# Patient Record
Sex: Female | Born: 1980 | Race: Black or African American | Hispanic: No | Marital: Single | Smoking: Former smoker
Health system: Southern US, Community
[De-identification: ages and names within clinical notes are randomized; demographics above are authoritative.]

---

## 2005-10-28 HISTORY — PX: CHOLECYSTECTOMY: SHX55

## 2011-03-04 ENCOUNTER — Emergency Department (HOSPITAL_COMMUNITY)
Admission: EM | Admit: 2011-03-04 | Discharge: 2011-03-04 | Disposition: A | Discharge status: Home / Self Care | Payer: Medicaid Other | Attending: Emergency Medicine | Admitting: Emergency Medicine

## 2011-03-04 DIAGNOSIS — K2990 Gastroduodenitis, unspecified, without bleeding: Secondary | ICD-10-CM | POA: Insufficient documentation

## 2011-03-04 DIAGNOSIS — R10811 Right upper quadrant abdominal tenderness: Secondary | ICD-10-CM | POA: Insufficient documentation

## 2011-03-04 DIAGNOSIS — Z9089 Acquired absence of other organs: Secondary | ICD-10-CM | POA: Insufficient documentation

## 2011-03-04 DIAGNOSIS — K2970 Gastritis, unspecified, without bleeding: Secondary | ICD-10-CM | POA: Insufficient documentation

## 2011-03-04 DIAGNOSIS — I10 Essential (primary) hypertension: Secondary | ICD-10-CM | POA: Insufficient documentation

## 2011-03-04 LAB — URINALYSIS, ROUTINE W REFLEX MICROSCOPIC
Nitrite: NEGATIVE
Specific Gravity, Urine: 1.028 (ref 1.005–1.030)
pH: 6 (ref 5.0–8.0)

## 2011-03-04 LAB — COMPREHENSIVE METABOLIC PANEL
ALT: 7 U/L (ref 0–35)
AST: 14 U/L (ref 0–37)
Alkaline Phosphatase: 71 U/L (ref 39–117)
GFR calc Af Amer: >60 mL/min (ref >60)
Glucose, Bld: 101 mg/dL — ABNORMAL HIGH (ref 70–99)
Potassium: 3.8 mEq/L (ref 3.5–5.1)
Sodium: 136 mEq/L (ref 135–145)
Total Protein: 6.4 g/dL (ref 6.0–8.3)

## 2011-03-04 LAB — POCT PREGNANCY, URINE: Preg Test, Ur: NEGATIVE

## 2011-03-04 LAB — DIFFERENTIAL
Basophils Absolute: 0 10*3/uL (ref 0.0–0.1)
Eosinophils Relative: 4 % (ref 0–5)
Lymphocytes Relative: 27 % (ref 12–46)
Neutro Abs: 7.2 10*3/uL (ref 1.7–7.7)

## 2011-03-04 LAB — CBC
HCT: 36.4 % (ref 36.0–46.0)
Hemoglobin: 11.7 g/dL — ABNORMAL LOW (ref 12.0–15.0)
RDW: 13.3 % (ref 11.5–15.5)
WBC: 11.2 10*3/uL — ABNORMAL HIGH (ref 4.0–10.5)

## 2011-03-21 ENCOUNTER — Other Ambulatory Visit: Payer: Self-pay | Admitting: Obstetrics and Gynecology

## 2011-03-21 ENCOUNTER — Encounter (INDEPENDENT_AMBULATORY_CARE_PROVIDER_SITE_OTHER): Payer: Medicaid Other | Admitting: Obstetrics and Gynecology

## 2011-03-21 DIAGNOSIS — R1084 Generalized abdominal pain: Secondary | ICD-10-CM

## 2011-03-21 DIAGNOSIS — R1011 Right upper quadrant pain: Secondary | ICD-10-CM

## 2011-03-22 NOTE — Group Therapy Note (Signed)
Erica Wright, Erica Wright               ACCOUNT NO.:  [id number]  MEDICAL RECORD NO.:  [id number]           PATIENT TYPE:  A  LOCATION:  [facility]                   FACILITY:  [facility]  PHYSICIAN:  Argentina Donovan, MD        DATE OF BIRTH:  11-May-1981  DATE OF SERVICE:  03/21/2011                                 CLINIC NOTE  The patient is a 30 year old African American female, gravida 3, para 2- 0-1-90, with a 34-year-old child.  She weighs 297 pounds and 5 feet 8 inches tall.  Her blood pressure is normal at 115/76.  She has no known allergies.  She smokes one cigarette roll a day.  She is on Ortho-Tri- Cyclen Lo for birth control, and was referred by the emergency room at [facility] because she has upper right quadrant pain, which seems to come and go, sometimes 3-5 times in a day where that she has to go to fetal position and seems to be increased when she is having her period. She had her last Pap smear a year ago in March and her Pap smears have always been normal.  Abdomen exam is obese abdomen, this is soft, nontender even in the area that she points to where she is most tender. There is no guarding or rebound even she said that she usually feels a lump in that area when the pain comes by palpate, no masses.  No evidence of muscle spasm or hernia.  Not exactly sure what is going on in this patient.  She has had a gallbladder removed and this has started after that episode.  I am going to get a CT of her abdomen and pelvis, have her come back, and we will do a Pap smear at that time, and then evaluate those and see if there is any reason we can find for the pain.  IMPRESSION:  Upper right quadrant pain increased when she is having her period, intermittent, but disabling when it occurs, sometimes several times a day, it lasts for 10-15 minutes at that time.          ______________________________ Argentina Donovan, MD    PR/MEDQ  D:  03/21/2011  T:  03/22/2011  Job:  [id number]

## 2011-03-28 ENCOUNTER — Other Ambulatory Visit: Payer: Self-pay | Admitting: Obstetrics and Gynecology

## 2011-03-28 ENCOUNTER — Ambulatory Visit (HOSPITAL_COMMUNITY)
Admission: RE | Admit: 2011-03-28 | Discharge: 2011-03-28 | Disposition: A | Discharge status: Home / Self Care | Payer: Medicaid Other | Source: Ambulatory Visit | Attending: Obstetrics and Gynecology | Admitting: Obstetrics and Gynecology

## 2011-03-28 DIAGNOSIS — R1011 Right upper quadrant pain: Secondary | ICD-10-CM

## 2011-03-28 MED ORDER — IOHEXOL 300 MG/ML  SOLN
100.0000 mL | Freq: Once | INTRAMUSCULAR | Status: AC | PRN
  Administered 2011-03-28: 100 mL via INTRAVENOUS

## 2011-04-10 ENCOUNTER — Ambulatory Visit (INDEPENDENT_AMBULATORY_CARE_PROVIDER_SITE_OTHER): Payer: Medicaid Other | Admitting: Obstetrics and Gynecology

## 2011-04-10 ENCOUNTER — Other Ambulatory Visit: Payer: Self-pay | Admitting: Obstetrics and Gynecology

## 2011-04-10 DIAGNOSIS — Z124 Encounter for screening for malignant neoplasm of cervix: Secondary | ICD-10-CM

## 2011-04-10 DIAGNOSIS — Z01419 Encounter for gynecological examination (general) (routine) without abnormal findings: Secondary | ICD-10-CM

## 2011-04-11 NOTE — Assessment & Plan Note (Signed)
Erica Wright, Erica Wright               ACCOUNT NO.:  [id number]  MEDICAL RECORD NO.:  [id number]           PATIENT TYPE:  A  LOCATION:  [facility]         FACILITY:  [facility]  PHYSICIAN:  Argentina Donovan, MD        DATE OF BIRTH:  [birth year]  DATE OF SERVICE:  04/10/2011                                 CLINIC NOTE  HISTORY OF PRESENT ILLNESS:  This patient is a 30 year old gravida 3, para 2-0-1-2 African American female who has undergone gallbladder surgery and since then has had pain and the area gallbladder seems to be aggravated whenever she has a period increased.  I got a CT of her pelvis and abdomen and that was completely normal.  Last time she was seen, she had a completely normal blood pressure and today she has a blood pressure of 147/93 and 134/90.  She said somebody gave her blood pressure medication, she shows to me and it shows that she was given hydrochlorothiazide 12.5 mg.  She never took it, however, because before that she was on 25 and she had terrible leg cramps.  I told her that was probably from a lack of potassium and she needs potassium if the blood pressure is up.  So, I am going to check her blood pressure before she leaves and add some potassium to her regime.  IMPRESSION:  Hypertension.          ______________________________ Argentina Donovan, MD    PR/MEDQ  D:  04/10/2011  T:  04/11/2011  Job:  [id number]

## 2011-06-18 ENCOUNTER — Emergency Department (HOSPITAL_COMMUNITY)
Admission: EM | Admit: 2011-06-18 | Discharge: 2011-06-18 | Disposition: A | Discharge status: Home / Self Care | Payer: No Typology Code available for payment source | Attending: Emergency Medicine | Admitting: Emergency Medicine

## 2011-06-18 ENCOUNTER — Emergency Department (HOSPITAL_COMMUNITY): Payer: No Typology Code available for payment source

## 2011-06-18 DIAGNOSIS — T148XXA Other injury of unspecified body region, initial encounter: Secondary | ICD-10-CM | POA: Insufficient documentation

## 2011-06-18 DIAGNOSIS — Y92410 Unspecified street and highway as the place of occurrence of the external cause: Secondary | ICD-10-CM | POA: Insufficient documentation

## 2011-06-18 DIAGNOSIS — S139XXA Sprain of joints and ligaments of unspecified parts of neck, initial encounter: Secondary | ICD-10-CM | POA: Insufficient documentation

## 2011-06-18 DIAGNOSIS — S335XXA Sprain of ligaments of lumbar spine, initial encounter: Secondary | ICD-10-CM | POA: Insufficient documentation

## 2013-03-26 ENCOUNTER — Telehealth: Payer: Self-pay | Admitting: Obstetrics and Gynecology

## 2013-03-26 ENCOUNTER — Other Ambulatory Visit: Payer: Self-pay | Admitting: Obstetrics & Gynecology

## 2013-03-26 DIAGNOSIS — IMO0001 Reserved for inherently not codable concepts without codable children: Secondary | ICD-10-CM

## 2013-03-26 MED ORDER — NORGESTIMATE-ETH ESTRADIOL 0.25-35 MG-MCG PO TABS: 1.0000 | ORAL_TABLET | Freq: Every day | ORAL | Status: DC

## 2013-03-26 NOTE — Telephone Encounter (Signed)
Patient called requesting a refill of birth control pill to supplement her until her scheduled annual appt on 04/23/13. Per protocol Rx Refill sent. Patient understands that she will need to make it to this appointment otherwise there will be no more refills. Patient states understanding.

## 2013-04-06 ENCOUNTER — Encounter (HOSPITAL_COMMUNITY): Payer: Self-pay | Admitting: Emergency Medicine

## 2013-04-06 ENCOUNTER — Emergency Department (HOSPITAL_COMMUNITY)
Admission: EM | Admit: 2013-04-06 | Discharge: 2013-04-06 | Disposition: A | Discharge status: Home / Self Care | Payer: Medicaid Other | Attending: Emergency Medicine | Admitting: Emergency Medicine

## 2013-04-06 ENCOUNTER — Emergency Department (HOSPITAL_COMMUNITY): Payer: Medicaid Other

## 2013-04-06 DIAGNOSIS — R0602 Shortness of breath: Secondary | ICD-10-CM | POA: Insufficient documentation

## 2013-04-06 DIAGNOSIS — T148XXA Other injury of unspecified body region, initial encounter: Secondary | ICD-10-CM

## 2013-04-06 DIAGNOSIS — R071 Chest pain on breathing: Secondary | ICD-10-CM | POA: Insufficient documentation

## 2013-04-06 DIAGNOSIS — R0789 Other chest pain: Secondary | ICD-10-CM

## 2013-04-06 DIAGNOSIS — F17200 Nicotine dependence, unspecified, uncomplicated: Secondary | ICD-10-CM | POA: Insufficient documentation

## 2013-04-06 LAB — CBC
MCH: 29.1 pg (ref 26.0–34.0)
MCHC: 31.9 g/dL (ref 30.0–36.0)
MCV: 91.2 fL (ref 78.0–100.0)
Platelets: 416 10*3/uL — ABNORMAL HIGH (ref 150–400)
RDW: 13.5 % (ref 11.5–15.5)
WBC: 14.5 10*3/uL — ABNORMAL HIGH (ref 4.0–10.5)

## 2013-04-06 LAB — BASIC METABOLIC PANEL
BUN: 8 mg/dL (ref 6–23)
Calcium: 9.7 mg/dL (ref 8.4–10.5)
Chloride: 104 mEq/L (ref 96–112)
Creatinine, Ser: 0.75 mg/dL (ref 0.50–1.10)
GFR calc Af Amer: >90 mL/min (ref >90)
GFR calc non Af Amer: >90 mL/min (ref >90)

## 2013-04-06 LAB — POCT I-STAT TROPONIN I: Troponin i, poc: 0 ng/mL (ref 0.00–0.08)

## 2013-04-06 NOTE — ED Provider Notes (Signed)
Medical screening examination/treatment/procedure(s) were performed by non-physician practitioner and as supervising physician I was immediately available for consultation/collaboration.   Gwyneth Sprout, MD 04/06/13 [phone]

## 2013-04-06 NOTE — ED Provider Notes (Signed)
History     CSN: [id number]  Arrival date & time 04/06/13  1241   First MD Initiated Contact with Patient 04/06/13 1507      Chief Complaint  Patient presents with  . Chest Pain    (Consider location/radiation/quality/duration/timing/severity/associated sxs/prior treatment) HPI Comments: 32 year old female with no significant past medical history presents to the emergency department complaining of sudden onset midsternal chest pain occurring around 12:00 noon today after hanging up the phone while at work and standing up at her desk. Pain lasted about 10 minutes with intermittent sharp spurts, worse when moving her arms closer together or using her left arm in any way. She did not try any alleviating factors. No pain at rest. When she had the pain, states she has shortness of breath. Denies nausea, vomiting or diaphoresis. Denies fever, chills or cough.  Patient is a 32 y.o. female presenting with chest pain. The history is provided by the patient.  Chest Pain Associated symptoms: shortness of breath   Associated symptoms: no abdominal pain, no back pain, no cough, no diaphoresis, no dizziness, no fever, no headache, no nausea and not vomiting     History reviewed. No pertinent past medical history.  History reviewed. No pertinent past surgical history.  History reviewed. No pertinent family history.  History  Substance Use Topics  . Smoking status: Current Every Day Smoker  . Smokeless tobacco: Not on file  . Alcohol Use: Yes     Comment: occ    OB History   Grav Para Term Preterm Abortions TAB SAB Ect Mult Living                  Review of Systems  Constitutional: Negative for fever, chills and diaphoresis.  HENT: Negative for neck pain and neck stiffness.   Respiratory: Positive for shortness of breath. Negative for cough.   Cardiovascular: Positive for chest pain.  Gastrointestinal: Negative for nausea, vomiting and abdominal pain.  Musculoskeletal: Negative for  back pain.  Neurological: Negative for dizziness, light-headedness and headaches.  Psychiatric/Behavioral: Negative for confusion.  All other systems reviewed and are negative.    Allergies  Review of patient's allergies indicates no known allergies.  Home Medications   Current Outpatient Rx  Name  Route  Sig  Dispense  Refill  . norgestimate-ethinyl estradiol (ORTHO-CYCLEN,SPRINTEC,PREVIFEM) 0.25-35 MG-MCG tablet   Oral   Take 1 tablet by mouth daily.   1 Package   1     BP 156/99  Pulse 78  Temp(Src) 98.3 F (36.8 C) (Oral)  Resp 18  SpO2 99%  LMP 03/23/2013  Physical Exam  Nursing note and vitals reviewed. Constitutional: She is oriented to person, place, and time. She appears well-developed. No distress.  Obese  HENT:  Head: Normocephalic and atraumatic.  Mouth/Throat: Oropharynx is clear and moist.  Eyes: Conjunctivae and EOM are normal. Pupils are equal, round, and reactive to light.  Neck: Normal range of motion. Neck supple.  Cardiovascular: Normal rate, regular rhythm, normal heart sounds and intact distal pulses.   Pulmonary/Chest: Effort normal and breath sounds normal. No respiratory distress. She has no wheezes. She has no rales. She exhibits tenderness.    Abdominal: Soft. Bowel sounds are normal. There is no tenderness.  Musculoskeletal: Normal range of motion. She exhibits no edema.  Neurological: She is alert and oriented to person, place, and time.  Skin: Skin is warm and dry. She is not diaphoretic.  Psychiatric: She has a normal mood and affect. Her behavior is  normal.    ED Course  Procedures (including critical care time)  Labs Reviewed  CBC - Abnormal; Notable for the following:    WBC 14.5 (*)    Hemoglobin 11.6 (*)    Platelets 416 (*)    All other components within normal limits  BASIC METABOLIC PANEL  POCT I-STAT TROPONIN I   Dg Chest 2 View  04/06/2013   *RADIOLOGY REPORT*  Clinical Data: Chest pain  CHEST - 2 VIEW   Comparison: None.  Findings: Lungs are clear.  Heart is mildly enlarged with normal pulmonary vascularity.  No adenopathy.  No pneumothorax.  No bone lesions.  IMPRESSION:   Heart is mildly enlarged.  No edema or consolidation.   Original Report Authenticated By: Bretta Bang, M.D.     Date: 04/06/2013  Rate: 91  Rhythm: normal sinus rhythm  QRS Axis: left  Intervals: normal  ST/T Wave abnormalities: normal  Conduction Disutrbances:none  Narrative Interpretation: no stemi  Old EKG Reviewed: none available   1. Chest wall pain   2. Muscle strain       MDM  32 year old female with reproducible chest pain on exam. Physical exam otherwise unremarkable. Chest pain only present with certain movements. Mild leukocytosis of 14.5, labs otherwise unremarkable. Afebrile and in no apparent distress. No family history of heart disease. Discharge with instructions to rest and take ibuprofen or Aleve for chest wall pain. Return precautions discussed. Resource guide given for PCP followup.      Trevor Mace, PA-C 04/06/13 1541

## 2013-04-06 NOTE — ED Notes (Signed)
Pt c/o midsternal CP with SOB starting today; pt sts worse with positioning and movement

## 2013-04-06 NOTE — ED Notes (Signed)
The pt only has pain if she moves or stretches

## 2013-04-23 ENCOUNTER — Telehealth: Payer: Self-pay | Admitting: *Deleted

## 2013-04-23 ENCOUNTER — Ambulatory Visit: Payer: Medicaid Other | Admitting: Advanced Practice Midwife

## 2013-04-23 DIAGNOSIS — IMO0001 Reserved for inherently not codable concepts without codable children: Secondary | ICD-10-CM

## 2013-04-23 MED ORDER — NORGESTIMATE-ETH ESTRADIOL 0.25-35 MG-MCG PO TABS: 1.0000 | ORAL_TABLET | Freq: Every day | ORAL | Status: DC

## 2013-04-23 NOTE — Telephone Encounter (Signed)
rx refill per dr Jolayne Panther

## 2013-08-05 ENCOUNTER — Ambulatory Visit (INDEPENDENT_AMBULATORY_CARE_PROVIDER_SITE_OTHER): Payer: Medicaid Other | Admitting: Advanced Practice Midwife

## 2013-08-05 ENCOUNTER — Encounter: Payer: Self-pay | Admitting: Advanced Practice Midwife

## 2013-08-05 ENCOUNTER — Other Ambulatory Visit (HOSPITAL_COMMUNITY)
Admission: RE | Admit: 2013-08-05 | Discharge: 2013-08-05 | Disposition: A | Discharge status: Home / Self Care | Payer: Medicaid Other | Source: Ambulatory Visit | Attending: Obstetrics and Gynecology | Admitting: Obstetrics and Gynecology

## 2013-08-05 VITALS — BP 139/92 | HR 93 | Temp 99.0°F | Ht 68.0 in | Wt 296.9 lb

## 2013-08-05 DIAGNOSIS — Z3041 Encounter for surveillance of contraceptive pills: Secondary | ICD-10-CM

## 2013-08-05 DIAGNOSIS — Z01419 Encounter for gynecological examination (general) (routine) without abnormal findings: Secondary | ICD-10-CM | POA: Insufficient documentation

## 2013-08-05 DIAGNOSIS — F17210 Nicotine dependence, cigarettes, uncomplicated: Secondary | ICD-10-CM

## 2013-08-05 DIAGNOSIS — N898 Other specified noninflammatory disorders of vagina: Secondary | ICD-10-CM

## 2013-08-05 DIAGNOSIS — Z0000 Encounter for general adult medical examination without abnormal findings: Secondary | ICD-10-CM

## 2013-08-05 DIAGNOSIS — Z1151 Encounter for screening for human papillomavirus (HPV): Secondary | ICD-10-CM | POA: Insufficient documentation

## 2013-08-05 DIAGNOSIS — F17200 Nicotine dependence, unspecified, uncomplicated: Secondary | ICD-10-CM

## 2013-08-05 MED ORDER — METRONIDAZOLE 500 MG PO TABS: 500.0000 mg | ORAL_TABLET | Freq: Two times a day (BID) | ORAL | Status: AC

## 2013-08-05 MED ORDER — NORGESTIM-ETH ESTRAD TRIPHASIC 0.18/0.215/0.25 MG-25 MCG PO TABS: 1.0000 | ORAL_TABLET | Freq: Every day | ORAL | Status: DC

## 2013-08-05 NOTE — Progress Notes (Signed)
  Subjective:     Erica Wright is a 32 y.o. female and is here for a comprehensive physical exam and Pap. The patient reports vaginal discharge, increased following her menses.    History   Social History  . Marital Status: Single    Spouse Name: N/A    Number of Children: N/A  . Years of Education: N/A   Occupational History  . Not on file.   Social History Main Topics  . Smoking status: Current Every Day Smoker  . Smokeless tobacco: Never Used  . Alcohol Use: Yes     Comment: occ  . Drug Use: No  . Sexual Activity: Yes   Other Topics Concern  . Not on file   Social History Narrative  . No narrative on file   Health Maintenance  Topic Date Due  . Tetanus/tdap  02/01/2000  . Influenza Vaccine  05/28/2013  . Pap Smear  04/09/2014    The following portions of the patient's history were reviewed and updated as appropriate: allergies, current medications, past family history, past medical history, past social history, past surgical history and problem list.  Review of Systems A comprehensive review of systems was negative.   Objective:    BP 139/92  Pulse 93  Temp(Src) 99 F (37.2 C) (Oral)  Ht 5\' 8"  (1.727 m)  Wt 296 lb 14.4 oz (134.673 kg)  BMI 45.15 kg/m2  LMP 08/03/2013 General appearance: alert, cooperative and no distress Neck: no adenopathy, no carotid bruit, no JVD, supple, symmetrical, trachea midline and thyroid not enlarged, symmetric, no tenderness/mass/nodules Lungs: clear to auscultation bilaterally Breasts: normal appearance, no masses or tenderness, Inspection negative, Normal to palpation without dominant masses Heart: regular rate and rhythm, S1, S2 normal, no murmur, click, rub or gallop Abdomen: soft, non-tender; bowel sounds normal; no masses,  no organomegaly Pelvic: cervix normal in appearance, external genitalia normal, no adnexal masses or tenderness, no cervical motion tenderness, rectovaginal septum normal, uterus normal size, shape,  and consistency and thin white discharge Extremities: extremities normal, atraumatic, no cyanosis or edema and Homans sign is negative, no sign of DVT Skin: Skin color, texture, turgor normal. No rashes or lesions Lymph nodes: Cervical, supraclavicular, and axillary nodes normal.    Assessment:     1. Annual physical exam   2. Vaginal discharge        Plan:    1.  Discussed progesterone only and long acting contraceptives with pt as most effective, safest forms of hormonal birth control, with smoking increasing risks with OCPs.  No other risk factors and pt prefers to continue OCPs at this time.  Renewed prescription.  2.  Flagyl 500 mg BID prescribed. Wet prep pending. 3.  F/U in 1  Year or as needed

## 2013-08-05 NOTE — Patient Instructions (Signed)
Recommend probiotics, like yogurt, to prevent vaginal infections.

## 2013-08-06 ENCOUNTER — Telehealth: Payer: Self-pay | Admitting: Advanced Practice Midwife

## 2013-08-06 DIAGNOSIS — Z3041 Encounter for surveillance of contraceptive pills: Secondary | ICD-10-CM | POA: Insufficient documentation

## 2013-08-06 DIAGNOSIS — F17210 Nicotine dependence, cigarettes, uncomplicated: Secondary | ICD-10-CM | POA: Insufficient documentation

## 2013-08-06 LAB — WET PREP, GENITAL

## 2013-08-06 NOTE — Telephone Encounter (Signed)
Called pt to let her know about positive trichomonas results on wet prep 08/05/13.  Pt prescribed Flagyl 500 BID x7 days during office visit for suspected BV, so no additional treatment needed.  Discussed importance of partner treatment with pt. Pt stated understanding.

## 2013-08-06 NOTE — Telephone Encounter (Signed)
Left message for pt to return call regarding lab results.  Pt wet prep positive for Trich.  Pt prescribed Flagyl 500 mg BID x7 days at time of visit for possible BV, so no additional tx needed.  Need to discuss partner treatment with pt.

## 2014-04-11 ENCOUNTER — Other Ambulatory Visit: Payer: Self-pay | Admitting: Obstetrics and Gynecology

## 2014-05-06 ENCOUNTER — Other Ambulatory Visit: Payer: Self-pay | Admitting: Obstetrics and Gynecology

## 2014-05-09 ENCOUNTER — Other Ambulatory Visit: Payer: Self-pay | Admitting: Obstetrics and Gynecology

## 2014-08-31 ENCOUNTER — Telehealth: Payer: Self-pay | Admitting: *Deleted

## 2014-08-31 DIAGNOSIS — Z0000 Encounter for general adult medical examination without abnormal findings: Secondary | ICD-10-CM

## 2014-08-31 MED ORDER — NORGESTIM-ETH ESTRAD TRIPHASIC 0.18/0.215/0.25 MG-25 MCG PO TABS: 1.0000 | ORAL_TABLET | Freq: Every day | ORAL | Status: DC

## 2014-08-31 NOTE — Telephone Encounter (Signed)
Pt called and requested refill of OCP's.  She stated that she took her last pill 4 days ago and her pharmacy has sent a refill request to the provider but it has not been filled.  I advised pt that I can authorize 3 months of refill and she will need annual Gyn exam prior to any additional refills.  Pt voiced understanding and agreed.  She stated that a detailed message can be left on her voice mail regarding her appt once it is scheduled.

## 2014-10-19 ENCOUNTER — Ambulatory Visit (INDEPENDENT_AMBULATORY_CARE_PROVIDER_SITE_OTHER): Payer: Medicaid Other | Admitting: Obstetrics & Gynecology

## 2014-10-19 ENCOUNTER — Encounter: Payer: Self-pay | Admitting: Obstetrics & Gynecology

## 2014-10-19 VITALS — BP 129/84 | HR 72 | Ht 68.0 in | Wt 326.1 lb

## 2014-10-19 DIAGNOSIS — N898 Other specified noninflammatory disorders of vagina: Secondary | ICD-10-CM

## 2014-10-19 DIAGNOSIS — Z01419 Encounter for gynecological examination (general) (routine) without abnormal findings: Secondary | ICD-10-CM

## 2014-10-19 DIAGNOSIS — Z0000 Encounter for general adult medical examination without abnormal findings: Secondary | ICD-10-CM

## 2014-10-19 MED ORDER — NORGESTIM-ETH ESTRAD TRIPHASIC 0.18/0.215/0.25 MG-25 MCG PO TABS: 1.0000 | ORAL_TABLET | Freq: Every day | ORAL | Status: AC

## 2014-10-19 NOTE — Progress Notes (Signed)
Patient ID: Erica Wright, female   DOB: 01/04/1981, 33 y.o.   MRN: [id number] Subjective:     Erica Wright is a 33 y.o. female here for a routine exam.  Current complaints: none.  Pt on OCP's.  Wants to continue them- denies problems.     Gynecologic History Patient's last menstrual period was 09/20/2014. Contraception: OCP (estrogen/progesterone) Orthotricyclen Lo  Last Pap: WNL with neg HPV. Trich 07/2013 Last mammogram: never had.  Obstetric History OB History  Gravida Para Term Preterm AB SAB TAB Ectopic Multiple Living  4 2 2  2  2   2     # Outcome Date GA Lbr Len/2nd Weight Sex Delivery Anes PTL Lv  4 Term      Vag-Spont     3 Term      Vag-Spont     2 TAB           1 TAB                The following portions of the patient's history were reviewed and updated as appropriate: allergies, current medications, past family history, past medical history, past social history, past surgical history and problem list.  Review of Systems A comprehensive review of systems was negative.    Objective:    BP 129/84 mmHg  Pulse 72  Ht 5\' 8"  (1.727 m)  Wt 326 lb 1.6 oz (147.918 kg)  BMI 49.59 kg/m2  LMP 09/20/2014  General Appearance:    Alert, cooperative, no distress, appears stated age  Head:    Normocephalic, without obvious abnormality, atraumatic              Neck:   Supple, symmetrical, trachea midline, no adenopathy;    thyroid:  no enlargement/tenderness/nodules; no carotid   bruit or JVD  Back:     Symmetric, no curvature, ROM normal, no CVA tenderness  Lungs:     Clear to auscultation bilaterally, respirations unlabored  Chest Wall:    No tenderness or deformity   Heart:    Regular rate and rhythm, S1 and S2 normal, no murmur, rub   or gallop  Breast Exam:    No tenderness, masses, or nipple abnormality  Abdomen:     Soft, non-tender, bowel sounds active all four quadrants,    no masses, no organomegaly; obese  Genitalia:    Normal female without lesion,  discharge or tenderness     Extremities:   Extremities normal, atraumatic, no cyanosis or edema  Pulses:   2+ and symmetric all extremities  Skin:   Skin color, texture, turgor normal, no rashes or lesions            Assessment:    Healthy female exam.   Cervical cx and wet smear obtained   Plan:    Follow up in: 1 year.    Refilled Orthotricyclen Lo F/u cx and wet smear Next PAP in 2017

## 2014-10-19 NOTE — Patient Instructions (Signed)

## 2014-10-20 LAB — GC/CHLAMYDIA PROBE AMP
CT PROBE, AMP APTIMA: NEGATIVE
GC PROBE AMP APTIMA: NEGATIVE

## 2014-10-20 LAB — WET PREP, GENITAL

## 2014-10-25 LAB — SURESWAB BACTERIAL VAGINOSIS/ITIS
Atopobium vaginae: 6.6 Log (cells/mL)
C. ALBICANS, DNA: NOT DETECTED
C. TROPICALIS, DNA: NOT DETECTED
C. glabrata, DNA: NOT DETECTED
C. parapsilosis, DNA: NOT DETECTED
Gardnerella vaginalis: 7.4 Log (cells/mL)
LACTOBACILLUS SPECIES: NOT DETECTED Log (cells/mL)
MEGASPHAERA SPECIES: 7.1 Log (cells/mL)
T. VAGINALIS RNA, QL TMA: NOT DETECTED

## 2015-02-28 IMAGING — CR DG CHEST 2V
2 series · 2 of 2 positions shown · non-contrast
Comparison: None.

CLINICAL DATA: Chest pain

CHEST - 2 VIEW

[w chest pa]
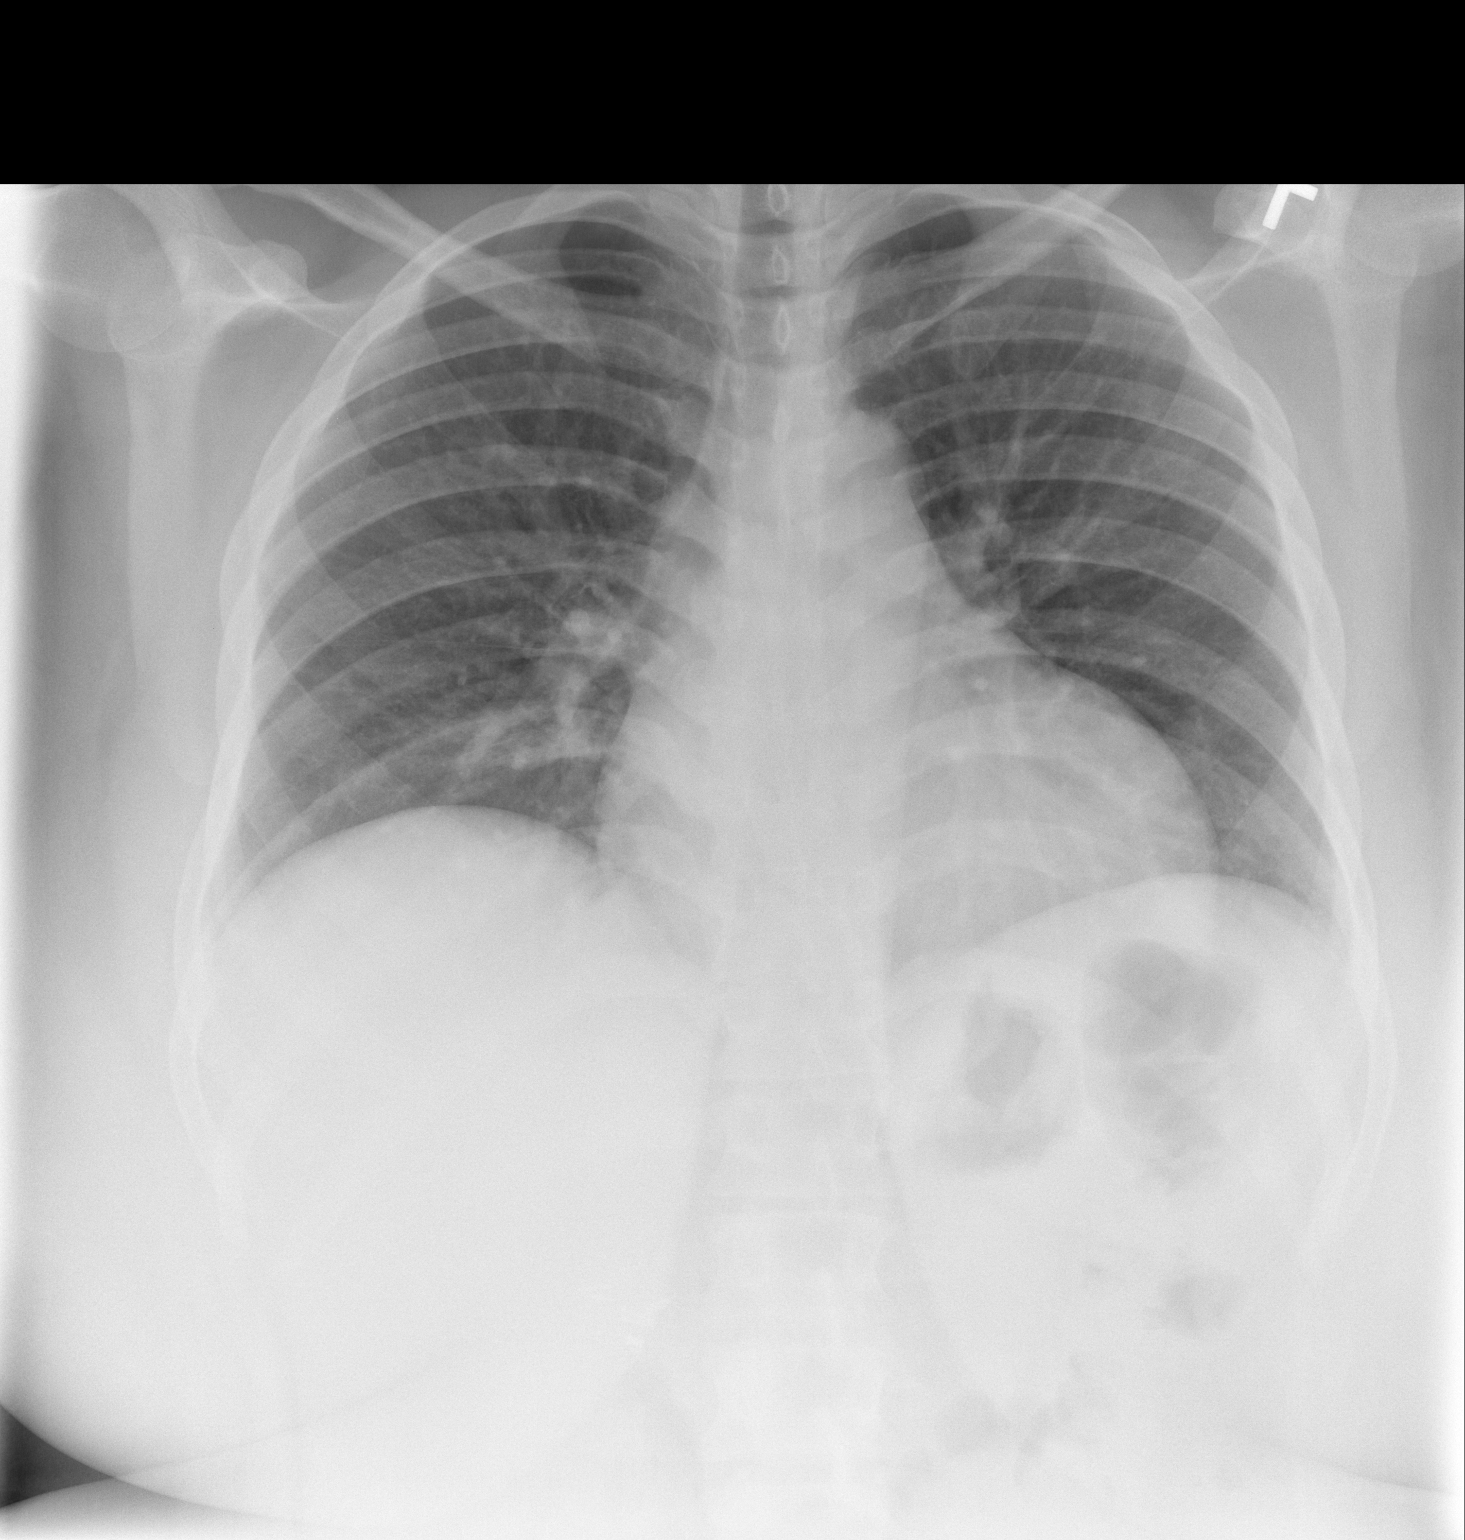

[w chest lat]
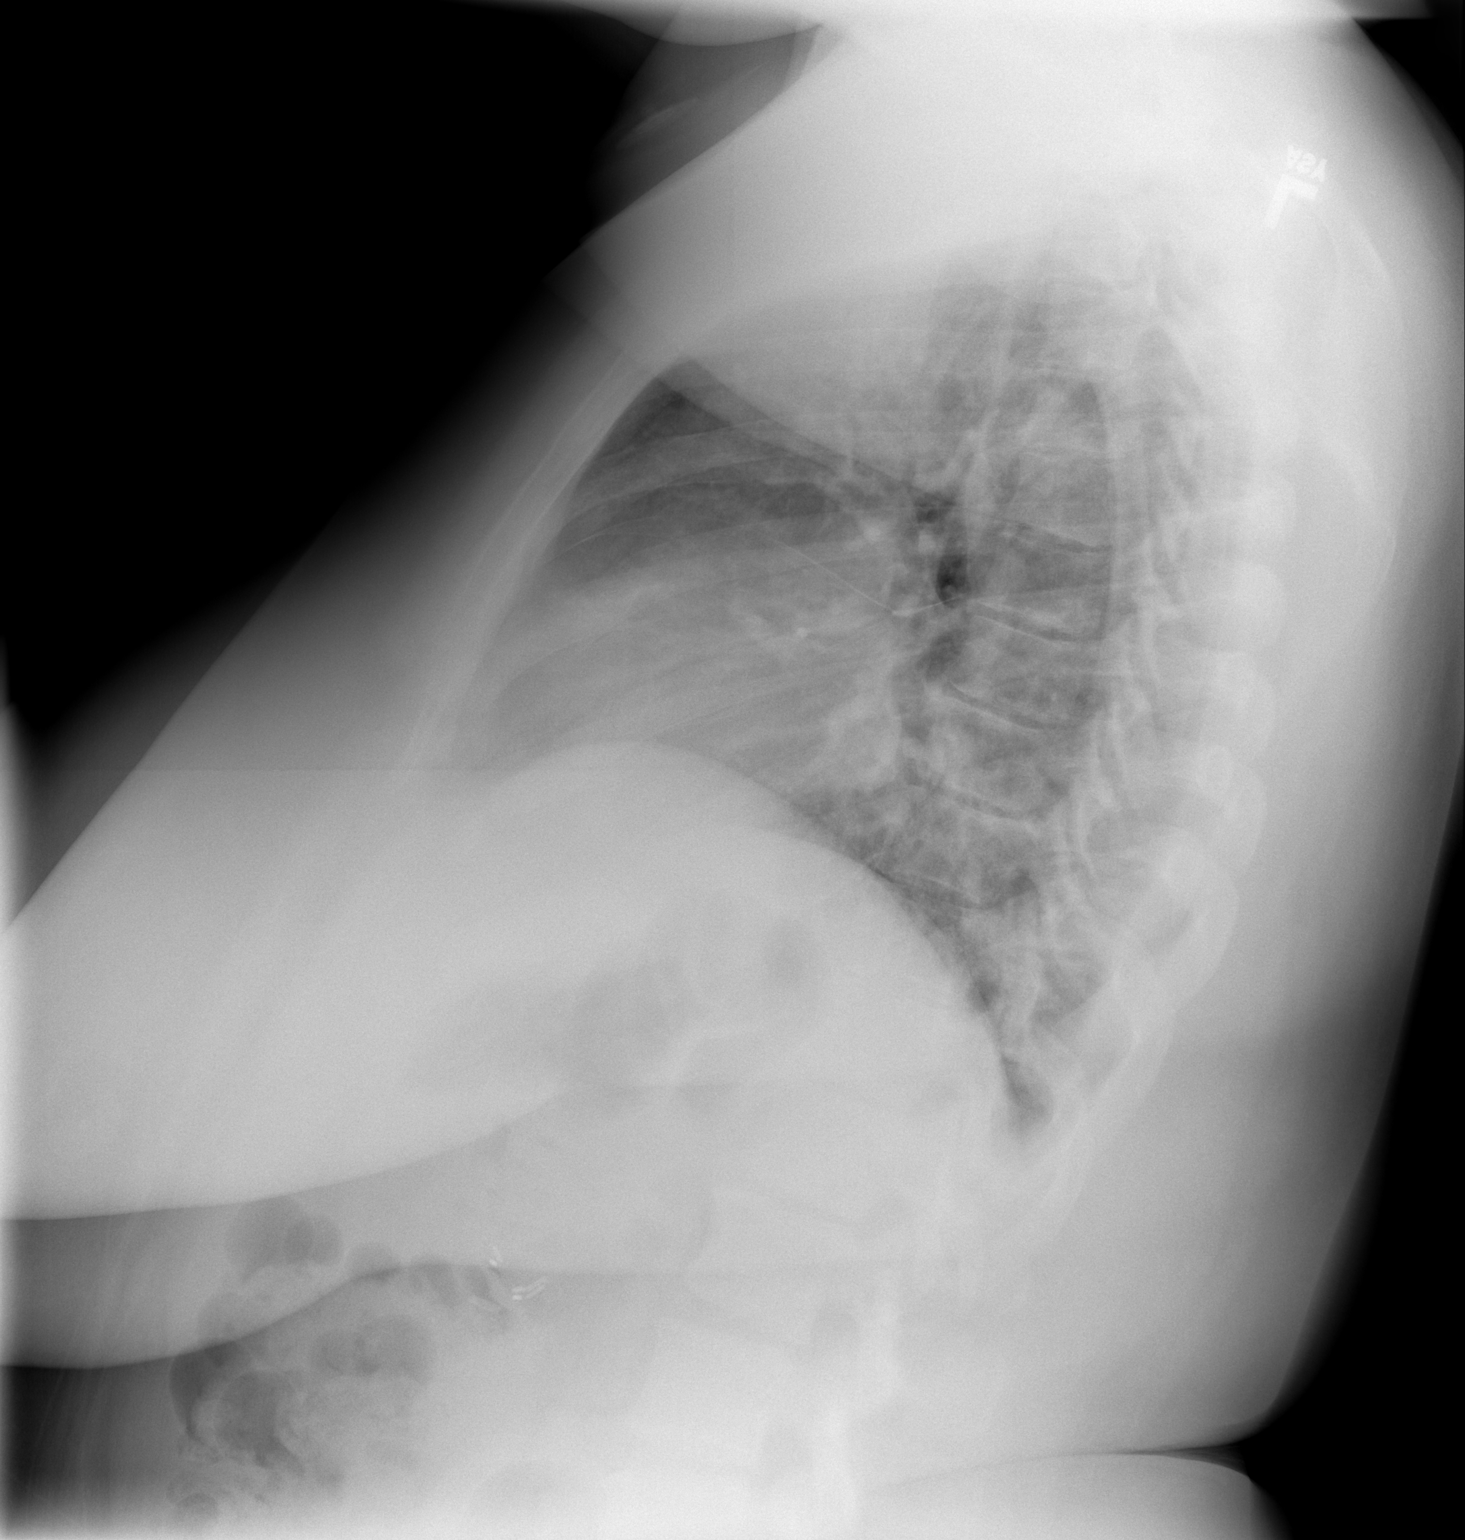

[2 of 2 positions shown; findings below may reference images not displayed]

FINDINGS: Lungs are clear.  Heart is mildly enlarged with normal
pulmonary vascularity.  No adenopathy.  No pneumothorax.  No bone
lesions.
IMPRESSION: Heart is mildly enlarged.  No edema or consolidation.

## 2015-10-17 ENCOUNTER — Other Ambulatory Visit: Payer: Self-pay | Admitting: Obstetrics & Gynecology
